# Patient Record
Sex: Male | Born: 1990 | Race: White | Hispanic: No | Marital: Single | Smoking: Never smoker
Health system: Southern US, Community
[De-identification: ages and names within clinical notes are randomized; demographics above are authoritative.]

## PROBLEM LIST (undated history)

## (undated) DIAGNOSIS — L709 Acne, unspecified: Secondary | ICD-10-CM

## (undated) DIAGNOSIS — F909 Attention-deficit hyperactivity disorder, unspecified type: Secondary | ICD-10-CM

## (undated) HISTORY — DX: Attention-deficit hyperactivity disorder, unspecified type: F90.9

## (undated) HISTORY — DX: Acne, unspecified: L70.9

---
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
## 2010-01-03 ENCOUNTER — Telehealth: Payer: Self-pay | Admitting: Internal Medicine

## 2010-01-29 ENCOUNTER — Ambulatory Visit: Payer: Self-pay | Admitting: Internal Medicine

## 2010-03-04 ENCOUNTER — Telehealth (INDEPENDENT_AMBULATORY_CARE_PROVIDER_SITE_OTHER): Payer: Self-pay | Admitting: *Deleted

## 2010-04-07 ENCOUNTER — Telehealth (INDEPENDENT_AMBULATORY_CARE_PROVIDER_SITE_OTHER): Payer: Self-pay | Admitting: *Deleted

## 2010-05-05 ENCOUNTER — Telehealth (INDEPENDENT_AMBULATORY_CARE_PROVIDER_SITE_OTHER): Payer: Self-pay | Admitting: *Deleted

## 2010-06-13 ENCOUNTER — Telehealth (INDEPENDENT_AMBULATORY_CARE_PROVIDER_SITE_OTHER): Payer: Self-pay | Admitting: *Deleted

## 2010-07-22 NOTE — Assessment & Plan Note (Signed)
Summary: pink eye??//lch   Vital Signs:  Patient profile:   20 year old male Height:      70.5 inches Weight:      166.13 pounds BMI:     23.59 Pulse rate:   58 / minute BP sitting:   100 / 56 CC: c/o green drainage L eye matted in am, itchy   History of Present Illness: 20 yo boy here today for ? pink eye.  c/o eye itching throughout the day.  waking up w/ L eye matted shut w/ green discharge.  no drainage throughout the day.  no redness to eye.  hx of seasonal allergies.  not taking anything currently.  no recent exposure to saw dust, metal filings, etc.  no eye pain.  Allergies (verified): No Known Drug Allergies  Review of Systems      See HPI  Physical Exam  General:  alert and well-developed.   Head:  normocephalic and atraumatic.   Eyes:  PERRL, EOMI, no conjunctival injxn or inflammation.  no drainage from eye, no matting of lashes Nose:  + congestion and rhinorrhea Mouth:  +PND   Impression & Recommendations:  Problem # 1:  ALLERGIC RHINITIS CAUSE UNSPECIFIED (ICD-477.9) Assessment Deteriorated pt's eye sxs are not pink eye- they are allergy related.  start Patanase for both eye and nasal sxs.  add OTC claritin/zyrtec.  reviewed supportive care and red flags that should prompt return.  Pt expresses understanding and is in agreement w/ this plan. His updated medication list for this problem includes:    Patanase 0.6 % Soln (Olopatadine hcl) .Marland Kitchen... 2 sprays each nostril two times a day.  Complete Medication List: 1)  Duac Cs 1-5 % Kit (Clindamycin-benzoyl per-cleans) .... Apply once daily 2)  Adderall 15 Mg Tabs (Amphetamine-dextroamphetamine) .... 2 by mouth in am , 1 by mouth in the afternoon if needed 3)  Amoxicillin 500 Mg Caps (Amoxicillin) .... 2 by mouth two times a day x 1 week 4)  Patanase 0.6 % Soln (Olopatadine hcl) .... 2 sprays each nostril two times a day.  Patient Instructions: 1)  This is NOT pink eye- this is eye allergies 2)  Use the nasal spray  to treat both your eye and nose symptoms 3)  2 sprays in each nostril two times a day 4)  Add Claritin or Zyrtec to help with your symptoms 5)  You are not contagious! 6)  Hang in there!! Prescriptions: PATANASE 0.6 % SOLN (OLOPATADINE HCL) 2 sprays each nostril two times a day.  #1 x 3   Entered and Authorized by:   Neena Rhymes MD   Signed by:   Neena Rhymes MD on 10/17/2009   Method used:   Electronically to        [facility] [phone]* (retail)       [address]       [place], [place]  [address]       Ph: [phone]       Fax: [phone]   RxID:   [id number]

## 2010-07-22 NOTE — Progress Notes (Signed)
Summary: refill  Phone Note Refill Request Call back at [phone] Message from:  Patient on September 13, 2009 8:23 AM  Refills Requested: Medication #1:  ADDERALL 15 MG TABS 2 by mouth in am  Method Requested: Pick up at Office Next Appointment Scheduled: no appt Initial call taken by: Barb Merino,  September 13, 2009 8:23 AM    Prescriptions: ADDERALL 15 MG TABS (AMPHETAMINE-DEXTROAMPHETAMINE) 2 by mouth in am , 1 by mouth in the afternoon if needed  #90 x 0   Entered by:   Shary Decamp   Authorized by:   Nolon Rod. Paz MD   Signed by:   Shary Decamp on 09/13/2009   Method used:   Print then Give to Patient   RxID:   [id number]

## 2010-07-22 NOTE — Progress Notes (Signed)
Summary: ADDERALL  Phone Note Refill Request Call back at [phone]  [facility] Message from:  Patient  Refills Requested: Medication #1:  ADDERALL 15 MG TABS 2 by mouth in am ADDERALL. CALL MOTHER  Initial call taken by: Kandice Hams,  July 08, 2009 3:21 PM  Follow-up for Phone Call        LEFT MSG RX WILL BE READY FOR PICKUP .Kandice Hams  July 08, 2009 3:42 PM  Follow-up by: Kandice Hams,  July 08, 2009 3:42 PM    Prescriptions: ADDERALL 15 MG TABS (AMPHETAMINE-DEXTROAMPHETAMINE) 2 by mouth in am , 1 by mouth in the afternoon if needed  #90 x 0   Entered by:   Kandice Hams   Authorized by:   Nolon Rod. Paz MD   Signed by:   Kandice Hams on 07/08/2009   Method used:   Print then Give to Patient   RxID:   [id number]

## 2010-07-22 NOTE — Progress Notes (Signed)
Summary: ok refill, needs office visit within 2 months  Phone Note Refill Request Message from:  Patient  Refills Requested: Medication #1:  ADDERALL 15 MG TABS 2 by mouth in am patient willl pick up [id number]  Initial call taken by: Okey Regal Spring,  January 03, 2010 1:36 PM  Follow-up for Phone Call        Pt has not been in since 12/2008 for a CPX. Last acute was 10/17/09. Okay to fill? Army Fossa CMA  January 03, 2010 1:52 PM  okay to refill this time and an additional time needs a routine visit within the next 2 months Harper Vandervoort E. Amreen Raczkowski MD  January 03, 2010 3:55 PM   Additional Follow-up for Phone Call Additional follow up Details #1::        Will have pt make appt at pick up of rx. Army Fossa CMA  January 03, 2010 4:05 PM     Prescriptions: ADDERALL 15 MG TABS (AMPHETAMINE-DEXTROAMPHETAMINE) 2 by mouth in am , 1 by mouth in the afternoon if needed  #90 x 0   Entered by:   Army Fossa CMA   Authorized by:   Nolon Rod. Alainah Phang MD   Signed by:   Army Fossa CMA on 01/03/2010   Method used:   Print then Give to Patient   RxID:   [id number]

## 2010-07-22 NOTE — Progress Notes (Signed)
Summary: refill  Phone Note Refill Request Call back at Work Phone [phone] Call back at [phone]   Refills Requested: Medication #1:  ADDERALL 15 MG TABS 2 by mouth in am call mom when ready   Method Requested: Pick up at Office Initial call taken by: Okey Regal Spring,  Nov 19, 2009 8:43 AM    Prescriptions: ADDERALL 15 MG TABS (AMPHETAMINE-DEXTROAMPHETAMINE) 2 by mouth in am , 1 by mouth in the afternoon if needed  #90 x 0   Entered by:   Shary Decamp   Authorized by:   Nolon Rod. Paz MD   Signed by:   Shary Decamp on 11/19/2009   Method used:   Print then Give to Patient   RxID:   [id number]
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
## 2010-07-22 NOTE — Progress Notes (Signed)
Summary: refill  Phone Note Refill Request Message from:  Fax from Pharmacy on March 04, 2010 9:21 AM  Refills Requested: Medication #1:  ADDERALL 15 MG TABS 2 by mouth in am mom will pick up wed  [id number] - after 12:00   Initial call taken by: Okey Regal Spring,  March 04, 2010 9:22 AM  Follow-up for Phone Call        will put upfront for pt. Army Fossa CMA  March 04, 2010 9:28 AM     Prescriptions: ADDERALL 15 MG TABS (AMPHETAMINE-DEXTROAMPHETAMINE) 2 by mouth in am , 1 by mouth in the afternoon if needed  #90 x 0   Entered by:   Army Fossa CMA   Authorized by:   Nolon Rod. Paz MD   Signed by:   Army Fossa CMA on 03/04/2010   Method used:   Print then Give to Patient   RxID:   [id number]

## 2010-07-22 NOTE — Progress Notes (Signed)
Summary: REFILL  Phone Note Refill Request Message from:  Patient on May 05, 2010 9:14 AM  Refills Requested: Medication #1:  ADDERALL 15 MG TABS 2 by mouth in am   Dosage confirmed as above?Dosage Confirmed   Supply Requested: 1 month PT AWARE READY FOR PICKUP WITHIN 24 HRS.  Next Appointment Scheduled: NONE Initial call taken by: Lavell Islam,  May 05, 2010 9:14 AM Caller: Patient    Prescriptions: ADDERALL 15 MG TABS (AMPHETAMINE-DEXTROAMPHETAMINE) 2 by mouth in am , 1 by mouth in the afternoon if needed  #90 x 0   Entered by:   Army Fossa CMA   Authorized by:   Nolon Rod. Paz MD   Signed by:   Army Fossa CMA on 05/05/2010   Method used:   Print then Give to Patient   RxID:   [id number]

## 2010-07-22 NOTE — Progress Notes (Signed)
Summary: adderall refill   Phone Note Refill Request Call back at Home Phone [phone] Message from:  Patient on August 09, 2009 11:18 AM  Refills Requested: Medication #1:  ADDERALL 15 MG TABS 2 by mouth in am Initial call taken by: Doristine Devoid,  August 09, 2009 11:18 AM  Follow-up for Phone Call        left detailed msg prescription ready for pick up .....Marland KitchenMarland KitchenDoristine Devoid  August 09, 2009 11:25 AM     Prescriptions: ADDERALL 15 MG TABS (AMPHETAMINE-DEXTROAMPHETAMINE) 2 by mouth in am , 1 by mouth in the afternoon if needed  #90 x 0   Entered by:   Doristine Devoid   Authorized by:   Nolon Rod. Paz MD   Signed by:   Doristine Devoid on 08/09/2009   Method used:   Print then Give to Patient   RxID:   [id number]

## 2010-07-22 NOTE — Progress Notes (Signed)
Summary: ADDERRAL REFILL  Phone Note Call from Patient Call back at [phone]   Caller: Mom--Shannon  [phone] Summary of Call: Patient needs Adderall prescription refilled.  Advised patient that it will be ready in 24 hours for pickup unless someone calls them Initial call taken by: Jerolyn Shin,  April 07, 2010 10:43 AM  Follow-up for Phone Call        placed upfront for pt. Follow-up by: Army Fossa CMA,  April 07, 2010 11:16 AM    Prescriptions: ADDERALL 15 MG TABS (AMPHETAMINE-DEXTROAMPHETAMINE) 2 by mouth in am , 1 by mouth in the afternoon if needed  #90 x 0   Entered by:   Army Fossa CMA   Authorized by:   Nolon Rod. Paz MD   Signed by:   Army Fossa CMA on 04/07/2010   Method used:   Print then Give to Patient   RxID:   [id number]
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
## 2010-07-24 NOTE — Progress Notes (Signed)
Summary: RX  Phone Note Refill Request Call back at [phone] Message from:  Patient  Refills Requested: Medication #1:  ADDERALL 15 MG TABS 2 by mouth in am   Supply Requested: 1 month CALL WHEN READY  Initial call taken by: Freddy Jaksch,  June 13, 2010 12:52 PM  Follow-up for Phone Call        left message for pt that rx was ready for pick up. Army Fossa CMA  June 17, 2010 8:07 AM     Prescriptions: ADDERALL 15 MG TABS (AMPHETAMINE-DEXTROAMPHETAMINE) 2 by mouth in am , 1 by mouth in the afternoon if needed  #90 x 0   Entered by:   Army Fossa CMA   Authorized by:   Nolon Rod. Paz MD   Signed by:   Army Fossa CMA on 06/17/2010   Method used:   Print then Give to Patient   RxID:   [id number]

## 2010-07-28 ENCOUNTER — Telehealth: Payer: Self-pay | Admitting: Internal Medicine

## 2010-08-07 NOTE — Progress Notes (Signed)
Summary: ADDERALL RX  Phone Note Call from Patient Call back at [phone]   Caller: Patient Reason for Call: Refill Medication Summary of Call: PATIENT NEEDS RX FOR REFILL OF ADDERALL 15MG .  LAST FILLED 06-17-2010, LAST OV 01-29-2010.  PLEASE CALL PHONE NUMBER ABOVE WHEN READY FOR PICK-UP. Initial call taken by: Magdalen Spatz [facility],  July 28, 2010 9:05 AM  Follow-up for Phone Call        left detailed message that rx was ready and pt would need office visit next month. Army Fossa CMA  July 28, 2010 10:21 AM     Prescriptions: ADDERALL 15 MG TABS (AMPHETAMINE-DEXTROAMPHETAMINE) 2 by mouth in am , 1 by mouth in the afternoon if needed  #90 x 0   Entered by:   Army Fossa CMA   Authorized by:   Nolon Rod. Matilde Pottenger MD   Signed by:   Army Fossa CMA on 07/28/2010   Method used:   Print then Give to Patient   RxID:   [id number]

## 2010-08-15 ENCOUNTER — Ambulatory Visit: Payer: Self-pay | Admitting: Internal Medicine

## 2010-08-22 ENCOUNTER — Encounter: Payer: Self-pay | Admitting: Internal Medicine

## 2010-08-22 ENCOUNTER — Ambulatory Visit (INDEPENDENT_AMBULATORY_CARE_PROVIDER_SITE_OTHER): Payer: Self-pay | Admitting: Internal Medicine

## 2010-08-22 DIAGNOSIS — L708 Other acne: Secondary | ICD-10-CM

## 2010-08-22 DIAGNOSIS — F909 Attention-deficit hyperactivity disorder, unspecified type: Secondary | ICD-10-CM
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
## 2010-09-22 ENCOUNTER — Telehealth: Payer: Self-pay | Admitting: Internal Medicine

## 2010-09-22 MED ORDER — AMPHETAMINE-DEXTROAMPHETAMINE 15 MG PO TABS: ORAL_TABLET | ORAL | Status: DC

## 2010-09-22 NOTE — Telephone Encounter (Signed)
adderall refill - wants to pick up today

## 2010-09-22 NOTE — Telephone Encounter (Signed)
Pt mom aware rx is ready.

## 2010-10-23 ENCOUNTER — Telehealth: Payer: Self-pay | Admitting: Internal Medicine

## 2010-10-23 MED ORDER — AMPHETAMINE-DEXTROAMPHETAMINE 15 MG PO TABS: ORAL_TABLET | ORAL | Status: DC

## 2010-10-23 NOTE — Telephone Encounter (Signed)
Patient needs refill for adderall---advised Mom that it would be ready for pickup after 3:00 on Friday 10/24/2010 unless she got a phone call

## 2010-10-23 NOTE — Telephone Encounter (Signed)
Will place up front

## 2010-11-25 ENCOUNTER — Other Ambulatory Visit: Payer: Self-pay | Admitting: Internal Medicine

## 2010-11-25 MED ORDER — AMPHETAMINE-DEXTROAMPHETAMINE 15 MG PO TABS: ORAL_TABLET | ORAL | Status: DC

## 2010-11-25 NOTE — Telephone Encounter (Signed)
Will place up front

## 2010-12-30 ENCOUNTER — Other Ambulatory Visit: Payer: Self-pay | Admitting: Internal Medicine

## 2010-12-30 MED ORDER — AMPHETAMINE-DEXTROAMPHETAMINE 15 MG PO TABS: ORAL_TABLET | ORAL | Status: DC

## 2010-12-30 NOTE — Telephone Encounter (Signed)
Placed up front

## 2011-02-04 ENCOUNTER — Other Ambulatory Visit: Payer: Self-pay | Admitting: Internal Medicine

## 2011-02-04 MED ORDER — AMPHETAMINE-DEXTROAMPHETAMINE 15 MG PO TABS: ORAL_TABLET | ORAL | Status: DC

## 2011-02-04 NOTE — Telephone Encounter (Signed)
LMOM to inform Pt Rx ready for p/u M-F 8-5

## 2011-02-04 NOTE — Telephone Encounter (Signed)
Adderall 15 mg request [last refill #90x0 12/30/2010]

## 2011-02-04 NOTE — Telephone Encounter (Signed)
Okay prescribed #90, no refills. Okay to keep refilling until 02-2011

## 2011-03-12 ENCOUNTER — Other Ambulatory Visit: Payer: Self-pay | Admitting: Internal Medicine

## 2011-03-12 MED ORDER — AMPHETAMINE-DEXTROAMPHETAMINE 15 MG PO TABS: ORAL_TABLET | ORAL | Status: DC

## 2011-03-12 NOTE — Telephone Encounter (Signed)
Left Pt detail message Rx will be ready tomorrow by 12 pm

## 2011-04-15 ENCOUNTER — Telehealth: Payer: Self-pay | Admitting: Internal Medicine

## 2011-04-15 MED ORDER — AMPHETAMINE-DEXTROAMPHETAMINE 15 MG PO TABS: ORAL_TABLET | ORAL | Status: DC

## 2011-04-15 NOTE — Telephone Encounter (Signed)
Done

## 2011-04-15 NOTE — Telephone Encounter (Signed)
Last ov  08/22/10 last refill was 03/12/11

## 2011-04-15 NOTE — Telephone Encounter (Signed)
Refill adderall - patient mom wants to pick up today Wednesday [phone]

## 2011-04-15 NOTE — Telephone Encounter (Signed)
Okay #90, no refills. Please tell patient's mother he needs a followup before next refill

## 2011-05-07 ENCOUNTER — Encounter: Payer: Self-pay | Admitting: Internal Medicine

## 2011-05-11 ENCOUNTER — Ambulatory Visit (INDEPENDENT_AMBULATORY_CARE_PROVIDER_SITE_OTHER): Payer: BC Managed Care – PPO | Admitting: Internal Medicine

## 2011-05-11 ENCOUNTER — Encounter: Payer: Self-pay | Admitting: Internal Medicine

## 2011-05-11 VITALS — BP 116/64 | HR 63 | Temp 97.7°F | Ht 71.5 in | Wt 177.4 lb

## 2011-05-11 DIAGNOSIS — F909 Attention-deficit hyperactivity disorder, unspecified type: Secondary | ICD-10-CM

## 2011-05-11 DIAGNOSIS — L708 Other acne: Secondary | ICD-10-CM

## 2011-05-11 DIAGNOSIS — Z23 Encounter for immunization: Secondary | ICD-10-CM

## 2011-05-11 MED ORDER — AMPHETAMINE-DEXTROAMPHETAMINE 15 MG PO TABS: ORAL_TABLET | ORAL | Status: DC
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
## 2011-05-11 NOTE — Assessment & Plan Note (Signed)
Well-controlled, refill medications. Back in 6 months.

## 2011-05-11 NOTE — Assessment & Plan Note (Signed)
Currently, mild symptoms, on no medicines

## 2011-05-11 NOTE — Patient Instructions (Signed)
Came back in 6 months for a check up, call for refills

## 2011-06-29 ENCOUNTER — Telehealth: Payer: Self-pay | Admitting: Internal Medicine

## 2011-06-29 MED ORDER — AMPHETAMINE-DEXTROAMPHETAMINE 15 MG PO TABS: ORAL_TABLET | ORAL | Status: DC

## 2011-06-29 NOTE — Telephone Encounter (Signed)
Rx ready for pick up. 

## 2011-06-29 NOTE — Telephone Encounter (Signed)
Patients mom states that patient needs a refill on adderall

## 2011-07-30 ENCOUNTER — Other Ambulatory Visit: Payer: Self-pay

## 2011-07-30 NOTE — Telephone Encounter (Signed)
Refill request Adderall. OK to refill?

## 2011-07-30 NOTE — Telephone Encounter (Signed)
Call from patient requesting Adderall Rx. Please advise     Kp

## 2011-07-31 MED ORDER — AMPHETAMINE-DEXTROAMPHETAMINE 15 MG PO TABS: ORAL_TABLET | ORAL | Status: DC

## 2011-07-31 NOTE — Telephone Encounter (Signed)
Addended by: Edwena Felty T on: 07/31/2011 12:12 PM   Modules accepted: Orders

## 2011-07-31 NOTE — Telephone Encounter (Signed)
Refill done.  

## 2011-07-31 NOTE — Telephone Encounter (Signed)
90, 0 RF 

## 2011-09-07 ENCOUNTER — Other Ambulatory Visit: Payer: Self-pay | Admitting: *Deleted

## 2011-09-07 MED ORDER — AMPHETAMINE-DEXTROAMPHETAMINE 15 MG PO TABS: ORAL_TABLET | ORAL | Status: DC

## 2011-09-07 NOTE — Telephone Encounter (Signed)
Pt mom aware Rx ready.

## 2011-09-11 ENCOUNTER — Encounter: Payer: Self-pay | Admitting: *Deleted

## 2011-10-13 ENCOUNTER — Other Ambulatory Visit: Payer: Self-pay

## 2011-10-13 MED ORDER — AMPHETAMINE-DEXTROAMPHETAMINE 15 MG PO TABS: ORAL_TABLET | ORAL | Status: DC

## 2011-10-13 NOTE — Telephone Encounter (Signed)
Carollee Herter aware that Rx is ready for pick up.      KP

## 2011-11-12 ENCOUNTER — Telehealth: Payer: Self-pay | Admitting: *Deleted

## 2011-11-12 MED ORDER — AMPHETAMINE-DEXTROAMPHETAMINE 15 MG PO TABS: ORAL_TABLET | ORAL | Status: DC

## 2011-11-12 NOTE — Telephone Encounter (Signed)
90, 0 RF 

## 2011-11-12 NOTE — Telephone Encounter (Signed)
Pt's mother called requesting a refill for Adderall. OK to refill?

## 2011-11-12 NOTE — Telephone Encounter (Signed)
Refill done.  

## 2012-01-18 ENCOUNTER — Other Ambulatory Visit: Payer: Self-pay | Admitting: *Deleted

## 2012-01-18 MED ORDER — AMPHETAMINE-DEXTROAMPHETAMINE 15 MG PO TABS: ORAL_TABLET | ORAL | Status: DC

## 2012-01-18 NOTE — Telephone Encounter (Signed)
Left Pt detail message that Rx is ready for pick up and OV is due prior to next refill, also placed note on Rx.

## 2012-01-18 NOTE — Telephone Encounter (Signed)
Last OV 11-12-10 #90, last OV 05-11-11 no pending Appt.

## 2012-01-18 NOTE — Telephone Encounter (Signed)
90, 0 RF also, please call patient--->  he needs office visit before next refill

## 2012-02-16 ENCOUNTER — Encounter: Payer: Self-pay | Admitting: Internal Medicine

## 2012-02-16 ENCOUNTER — Ambulatory Visit (INDEPENDENT_AMBULATORY_CARE_PROVIDER_SITE_OTHER): Payer: BC Managed Care – PPO | Admitting: Internal Medicine

## 2012-02-16 VITALS — BP 122/82 | HR 87 | Temp 97.8°F | Wt 184.0 lb

## 2012-02-16 DIAGNOSIS — F909 Attention-deficit hyperactivity disorder, unspecified type: Secondary | ICD-10-CM

## 2012-02-16 MED ORDER — AMPHETAMINE-DEXTROAMPHETAMINE 15 MG PO TABS: ORAL_TABLET | ORAL | Status: DC

## 2012-02-16 NOTE — Patient Instructions (Addendum)
Call for refills as needed. Next office visit in 6 months ------ If  redness, swelling or bleeding from the wound, either call or go to the health clinic at [facility]
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
## 2012-02-16 NOTE — Assessment & Plan Note (Signed)
Patient reports he could use a higher dose of meds, on further questioning, he has class from 12:30 PM to 9:30 PM and is taking 30 mg in am and 15 at PM Recommend to adjust doses according to his needs, for instance he could take 15 mg in the morning and 30 mg dose at 12:30 before class. Will call if that is not working for him

## 2012-03-16 ENCOUNTER — Telehealth: Payer: Self-pay

## 2012-03-16 MED ORDER — AMPHETAMINE-DEXTROAMPHETAMINE 15 MG PO TABS: ORAL_TABLET | ORAL | Status: DC

## 2012-03-16 NOTE — Telephone Encounter (Signed)
Done

## 2012-03-16 NOTE — Telephone Encounter (Signed)
Pt notified ready for pickup.

## 2012-03-16 NOTE — Telephone Encounter (Signed)
Pt's mother calling for his Adderall rx.  Please call when ready.

## 2012-04-20 ENCOUNTER — Other Ambulatory Visit: Payer: Self-pay

## 2012-04-20 NOTE — Addendum Note (Signed)
Addended by: Mal Amabile on: 04/20/2012 12:30 PM   Modules accepted: Orders

## 2012-04-20 NOTE — Telephone Encounter (Signed)
OV 02/16/12 Last filled Adderall 03/16/12 #90 no refill.  Plz advise    MW

## 2012-04-20 NOTE — Telephone Encounter (Signed)
OV 02/16/12 Adderall last filled 03/16/12 #90 no refills   Plz advise    MW

## 2012-04-21 MED ORDER — AMPHETAMINE-DEXTROAMPHETAMINE 15 MG PO TABS: ORAL_TABLET | ORAL | Status: DC

## 2012-04-21 NOTE — Telephone Encounter (Signed)
Called pt left message to make aware Rx ready for pick up.    MW

## 2012-04-21 NOTE — Telephone Encounter (Signed)
done

## 2012-05-23 ENCOUNTER — Other Ambulatory Visit: Payer: Self-pay

## 2012-05-23 MED ORDER — AMPHETAMINE-DEXTROAMPHETAMINE 15 MG PO TABS: ORAL_TABLET | ORAL | Status: DC

## 2012-05-23 NOTE — Telephone Encounter (Signed)
Pt made aware rx is ready for pick up. 

## 2012-05-23 NOTE — Telephone Encounter (Signed)
done

## 2012-05-23 NOTE — Telephone Encounter (Signed)
OV 02/16/12 Last filled 04/20/12 # 90 no refills plz advise    MW

## 2012-06-27 ENCOUNTER — Telehealth: Payer: Self-pay | Admitting: Internal Medicine

## 2012-06-27 ENCOUNTER — Encounter: Payer: Self-pay | Admitting: *Deleted

## 2012-06-27 MED ORDER — AMPHETAMINE-DEXTROAMPHETAMINE 15 MG PO TABS: ORAL_TABLET | ORAL | Status: DC

## 2012-06-27 NOTE — Telephone Encounter (Signed)
PTs mother called requesting rx for adderall. Call her at [phone] when ready for pick up. Patient goes to [facility] but is in town until this evening. His mother is not listed on his DPR.

## 2012-06-27 NOTE — Telephone Encounter (Signed)
OK to refill? Last OV 8.27.13 Last filled 12.2.13.

## 2012-06-27 NOTE — Telephone Encounter (Signed)
Done. Needs to sign a controlled substance agreement

## 2012-06-27 NOTE — Telephone Encounter (Signed)
Discussed with pt & made aware he has to come by & pick up rx & sign contract.

## 2012-07-27 ENCOUNTER — Telehealth: Payer: Self-pay | Admitting: Internal Medicine

## 2012-07-27 MED ORDER — AMPHETAMINE-DEXTROAMPHETAMINE 15 MG PO TABS: ORAL_TABLET | ORAL | Status: DC

## 2012-07-27 NOTE — Telephone Encounter (Signed)
Ok to refill? Last OV 8.27.13 Last filled 1.6.14

## 2012-07-27 NOTE — Telephone Encounter (Signed)
Patients mother called to request rx for adderall. Call [phone] when ready for pick up.

## 2012-07-27 NOTE — Telephone Encounter (Signed)
Done

## 2012-07-27 NOTE — Telephone Encounter (Signed)
Left msg on pt's vmail to make pt aware rx is ready at front desk to be picked up.

## 2012-08-18 ENCOUNTER — Encounter: Payer: Self-pay | Admitting: Internal Medicine

## 2012-08-25 ENCOUNTER — Telehealth: Payer: Self-pay | Admitting: *Deleted

## 2012-08-25 MED ORDER — AMPHETAMINE-DEXTROAMPHETAMINE 15 MG PO TABS: ORAL_TABLET | ORAL | Status: DC

## 2012-08-25 NOTE — Telephone Encounter (Signed)
Call mom , will RF this time but needs to be tested again before next RF

## 2012-08-25 NOTE — Telephone Encounter (Signed)
Last OV 02-16-12, last filled 07-28-11, Pt was high risk and screen was negative.Per office protocol Pt is supposed to be screen with every pick however Pt is at school [employer] and Pt mom called to request refill on med but is not on Pt DPR so unable to speak with her.Please advise on refill

## 2012-08-25 NOTE — Telephone Encounter (Signed)
Discussed with pt, he states that he would like for his mom to come by & pick up Rx & it's for her to be added to [facility].

## 2012-08-25 NOTE — Telephone Encounter (Signed)
Left msg for pt to return call.  Pt's mother is not on DPR.

## 2012-09-27 ENCOUNTER — Telehealth: Payer: Self-pay | Admitting: *Deleted

## 2012-09-27 MED ORDER — AMPHETAMINE-DEXTROAMPHETAMINE 15 MG PO TABS: ORAL_TABLET | ORAL | Status: DC

## 2012-09-27 NOTE — Telephone Encounter (Signed)
Rx ready for pick up called to advise Pt per Dr Drue Novel last instruction that he would RF this time but needs to be tested again before next RF. Pt states that he is currently at school and has about 2 weeks left with finals approaching. Pt indicated that he does not plan on coming home and does not want to go without this med. Pt would like to know if Dr Drue Novel is willing to waive this again.Please advise

## 2012-09-27 NOTE — Telephone Encounter (Signed)
Pt made aware

## 2012-09-27 NOTE — Telephone Encounter (Signed)
Ok RF No further RF w/o screening , let pt know

## 2012-11-04 ENCOUNTER — Telehealth: Payer: Self-pay | Admitting: Internal Medicine

## 2012-11-04 NOTE — Telephone Encounter (Signed)
Needs office visit.

## 2012-11-04 NOTE — Telephone Encounter (Signed)
Called pt and left a message informing him.

## 2012-11-04 NOTE — Telephone Encounter (Signed)
Patient called requesting rx for adderall. Call [phone].

## 2012-11-04 NOTE — Telephone Encounter (Signed)
Adderall refill request. Pt last seen on 02-16-12. Med last filled 09-27-12 #90 with 0 refills. Ok to fill?

## 2012-11-08 ENCOUNTER — Encounter: Payer: Self-pay | Admitting: Lab

## 2012-11-09 ENCOUNTER — Ambulatory Visit (INDEPENDENT_AMBULATORY_CARE_PROVIDER_SITE_OTHER): Payer: BC Managed Care – PPO | Admitting: Internal Medicine

## 2012-11-09 ENCOUNTER — Encounter: Payer: Self-pay | Admitting: *Deleted

## 2012-11-09 VITALS — BP 120/76 | HR 77 | Temp 97.9°F | Wt 176.0 lb

## 2012-11-09 DIAGNOSIS — F909 Attention-deficit hyperactivity disorder, unspecified type: Secondary | ICD-10-CM

## 2012-11-09 MED ORDER — AMPHETAMINE-DEXTROAMPHETAMINE 15 MG PO TABS: ORAL_TABLET | ORAL | Status: DC

## 2012-11-09 NOTE — Assessment & Plan Note (Signed)
Symptoms well-controlled, prescription filled. Last urine test came back negative for Adderall, we'll recheck today. The patient found a physician at [facility] that could refill his medication, this other physician just needs some documentation in order to prescribe it: see letter We will recheck a urine test today noting that he has only taken 2 tablets yesterday and nothing last week.
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
## 2012-11-09 NOTE — Patient Instructions (Addendum)
If the other doctor prescribe your medication , come back as needed. Otherwise please come back in 6 months.

## 2012-11-10 ENCOUNTER — Encounter: Payer: Self-pay | Admitting: Internal Medicine

## 2012-11-21 ENCOUNTER — Encounter: Payer: Self-pay | Admitting: Internal Medicine

## 2012-12-09 ENCOUNTER — Telehealth: Payer: Self-pay | Admitting: *Deleted

## 2012-12-09 NOTE — Telephone Encounter (Signed)
11-09-12 Last OV, Last refilled #90

## 2012-12-11 NOTE — Telephone Encounter (Signed)
Ok   1 month

## 2012-12-12 MED ORDER — AMPHETAMINE-DEXTROAMPHETAMINE 15 MG PO TABS: ORAL_TABLET | ORAL | Status: DC

## 2012-12-12 NOTE — Telephone Encounter (Signed)
Left Pt detail message Rx ready for pickup

## 2013-01-16 ENCOUNTER — Telehealth: Payer: Self-pay | Admitting: *Deleted

## 2013-01-16 NOTE — Telephone Encounter (Signed)
Ok 1 month supply 

## 2013-01-16 NOTE — Telephone Encounter (Signed)
Mom left message on triage requesting refill for pts adderall. Date of last OV 11/09/12 and med last filled on 11/21/12 for #90 with no RF. Okay to refill?

## 2013-01-17 MED ORDER — AMPHETAMINE-DEXTROAMPHETAMINE 15 MG PO TABS: ORAL_TABLET | ORAL | Status: DC

## 2013-01-17 NOTE — Telephone Encounter (Signed)
Refill done per orders and pt. Made aware rx ready for pickup 

## 2013-02-27 ENCOUNTER — Telehealth: Payer: Self-pay | Admitting: General Practice

## 2013-02-27 MED ORDER — AMPHETAMINE-DEXTROAMPHETAMINE 15 MG PO TABS: ORAL_TABLET | ORAL | Status: DC

## 2013-02-27 NOTE — Telephone Encounter (Signed)
Adderall refill Last OV 11-09-12 Med last filled 01-17-13 390   Med contract on file  High Risk

## 2013-02-27 NOTE — Telephone Encounter (Signed)
Correction, last UDS 10/2012 low risk, okay to refill, prescription printed

## 2013-02-28 ENCOUNTER — Telehealth: Payer: Self-pay | Admitting: *Deleted

## 2013-02-28 NOTE — Telephone Encounter (Signed)
Pt notified via telephone rx pickup. DJR

## 2013-03-27 ENCOUNTER — Telehealth: Payer: Self-pay | Admitting: *Deleted

## 2013-03-27 MED ORDER — AMPHETAMINE-DEXTROAMPHETAMINE 15 MG PO TABS: ORAL_TABLET | ORAL | Status: DC

## 2013-03-27 NOTE — Telephone Encounter (Signed)
Last UDS 10/2012 low risk, okay to refill, prescription printed Due for an appointment 04-2013, if he does not have an appointment scheduled please arrange one.

## 2013-03-27 NOTE — Telephone Encounter (Signed)
Pt is requesting refill on Adderall.  Last seen on 11/09/2012. Last filled on 02/27/2013. UDS on 11/09/2012, contract signed. Please advise

## 2013-03-28 ENCOUNTER — Telehealth: Payer: Self-pay | Admitting: *Deleted

## 2013-03-28 NOTE — Telephone Encounter (Signed)
Pt will call back and schedule  the appointment, he is currently away at school.

## 2013-03-28 NOTE — Telephone Encounter (Signed)
Call patient and left message to return call to schedule  follow up appointment

## 2013-03-28 NOTE — Telephone Encounter (Signed)
Error

## 2013-05-05 ENCOUNTER — Encounter: Payer: Self-pay | Admitting: Internal Medicine

## 2013-05-05 ENCOUNTER — Ambulatory Visit (INDEPENDENT_AMBULATORY_CARE_PROVIDER_SITE_OTHER): Payer: BC Managed Care – PPO | Admitting: Internal Medicine

## 2013-05-05 VITALS — BP 126/86 | HR 93 | Temp 98.4°F | Wt 178.0 lb

## 2013-05-05 DIAGNOSIS — F909 Attention-deficit hyperactivity disorder, unspecified type: Secondary | ICD-10-CM

## 2013-05-05 DIAGNOSIS — Z23 Encounter for immunization: Secondary | ICD-10-CM

## 2013-05-05 MED ORDER — AMPHETAMINE-DEXTROAMPHETAMINE 15 MG PO TABS: ORAL_TABLET | ORAL | Status: DC

## 2013-05-05 NOTE — Progress Notes (Signed)
Pre visit review using our clinic review tool, if applicable. No additional management support is needed unless otherwise documented below in the visit note. 

## 2013-05-05 NOTE — Patient Instructions (Signed)
Get your UDS  before you leave  Next visit in 6 months for a   follow up , ADD. No Fasting Please make an appointment
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
## 2013-05-05 NOTE — Assessment & Plan Note (Signed)
Doing very well, refill medications. Reports he has good time management skills. UDS today (Last adderall was more than 24 hours ago)

## 2013-05-24 ENCOUNTER — Telehealth: Payer: Self-pay | Admitting: Internal Medicine

## 2013-05-24 NOTE — Telephone Encounter (Signed)
UDS was + for amphetamines but aAlso showed alcohol---->  I spoke with the toxicologist, the urine alcohol level lags  8 hours behind the blood level thus it represent the level from previous day. Plan-- moderate risk

## 2013-06-06 ENCOUNTER — Telehealth: Payer: Self-pay | Admitting: *Deleted

## 2013-06-06 MED ORDER — AMPHETAMINE-DEXTROAMPHETAMINE 15 MG PO TABS: ORAL_TABLET | ORAL | Status: DC

## 2013-06-06 NOTE — Telephone Encounter (Signed)
Done  UDS 12-14 Was moderate risk, repeat by March

## 2013-06-06 NOTE — Telephone Encounter (Signed)
Patient's mother is requesting refill for Adderall  Last seen-05/05/2013  Last filled-05/05/2013  UDS-11/09/2012, contract signed   Please advise. SW

## 2013-06-12 NOTE — Telephone Encounter (Signed)
Patient notified that script was ready to be picked up.

## 2013-07-24 ENCOUNTER — Telehealth: Payer: Self-pay

## 2013-07-24 MED ORDER — AMPHETAMINE-DEXTROAMPHETAMINE 15 MG PO TABS: ORAL_TABLET | ORAL | Status: DC

## 2013-07-24 NOTE — Telephone Encounter (Signed)
Mother, Bebe LiterShannon Engel, called requesting prescription refill:  Adderall 15 mg; 2 PO in am and 1 in afternoon as needed.   Last OV: 05/05/13 Last Filled:  06/06/13 UDS- 11/09/12 (risk level not indicated)   Contract- signed on 06/27/12            Please advise.

## 2013-07-24 NOTE — Telephone Encounter (Signed)
UDS 12-14 Was moderate risk, repeat by March 2015 Okay to refill

## 2013-07-24 NOTE — Addendum Note (Signed)
Addended by: Willow OraPAZ, Pelham Hennick E on: 07/24/2013 05:13 PM   Modules accepted: Orders

## 2013-07-25 ENCOUNTER — Telehealth: Payer: Self-pay

## 2013-07-25 NOTE — Telephone Encounter (Signed)
Prescription available.  Patient/Mother made aware.  They may not be able to pick up prescription until Monday.

## 2013-08-07 ENCOUNTER — Encounter: Payer: Self-pay | Admitting: Internal Medicine

## 2013-09-04 ENCOUNTER — Telehealth: Payer: Self-pay | Admitting: *Deleted

## 2013-09-04 NOTE — Telephone Encounter (Signed)
amphetamine-dextroamphetamine (ADDERALL) 15 MG tablet Last OV: 05/05/2013 Last refill: 07/24/13 #90 Moderate risk, needs UDS

## 2013-09-05 MED ORDER — AMPHETAMINE-DEXTROAMPHETAMINE 15 MG PO TABS: ORAL_TABLET | ORAL | Status: DC

## 2013-09-05 NOTE — Telephone Encounter (Signed)
Ok RF, UDS at time of Rx pick up

## 2013-09-05 NOTE — Telephone Encounter (Signed)
LMOVM- rx at front desk rdy for pick up

## 2013-10-04 ENCOUNTER — Telehealth: Payer: Self-pay | Admitting: Internal Medicine

## 2013-10-04 MED ORDER — AMPHETAMINE-DEXTROAMPHETAMINE 15 MG PO TABS: ORAL_TABLET | ORAL | Status: DC

## 2013-10-04 NOTE — Telephone Encounter (Signed)
Patient called and requested a refill for adderall. Please advise.  °

## 2013-10-04 NOTE — Telephone Encounter (Signed)
rx refill- adderall 15mg  Last OV- 05/05/13 Last refilled- 09/05/13 #90 / 0 rf UDS- 05/24/13 moderate risk.

## 2013-10-04 NOTE — Telephone Encounter (Signed)
Okay to refill, due for a UDS at time of pick up

## 2013-10-05 NOTE — Telephone Encounter (Signed)
Pt verbalized understanding UDS. rx rdy for pick up at front desk.

## 2013-11-27 ENCOUNTER — Telehealth: Payer: Self-pay | Admitting: Internal Medicine

## 2013-11-27 MED ORDER — AMPHETAMINE-DEXTROAMPHETAMINE 15 MG PO TABS: ORAL_TABLET | ORAL | Status: DC

## 2013-11-27 NOTE — Telephone Encounter (Signed)
Pt came by stating he needed a refill on his Adderall and was told a wk ago to come by and do UDS in order to get refill.  RX was not at the front desk and last phone call in the chart was in Apr relating to a refill.  Please advise.

## 2013-11-27 NOTE — Telephone Encounter (Signed)
rx refill- adderrall 15mg   Last OV- 05/05/13 Last refilled- 10/04/13 # 90 / 0 rf  UDS- 05/24/13 moderate

## 2013-11-27 NOTE — Telephone Encounter (Signed)
UDS was requested 09-2013, get results please

## 2013-11-27 NOTE — Telephone Encounter (Signed)
I have the results from the past year, last UDS was collected on 05/05/2013. We did not collect a UDS in April when the patient picked up his prescription even though the documentation states the patient was told he needed to do one.

## 2013-11-27 NOTE — Telephone Encounter (Signed)
Prescription printed, to be released only when he provides the UDS sample

## 2013-11-28 NOTE — Telephone Encounter (Signed)
LMOVM. rx front desk / UDS required at time of pick up

## 2013-11-29 NOTE — Telephone Encounter (Signed)
Gave info to mom and she voiced she understood and would relay message to her son.

## 2013-12-15 ENCOUNTER — Telehealth: Payer: Self-pay | Admitting: *Deleted

## 2013-12-15 NOTE — Telephone Encounter (Signed)
UDS MOD risk- 11/27/13 @ DR.PAZ

## 2013-12-21 ENCOUNTER — Encounter: Payer: Self-pay | Admitting: Internal Medicine

## 2013-12-27 ENCOUNTER — Telehealth: Payer: Self-pay | Admitting: *Deleted

## 2013-12-27 MED ORDER — AMPHETAMINE-DEXTROAMPHETAMINE 15 MG PO TABS: ORAL_TABLET | ORAL | Status: DC

## 2013-12-27 NOTE — Telephone Encounter (Signed)
lmovm . rx rdy for pick up front desk , needs OV

## 2013-12-27 NOTE — Telephone Encounter (Signed)
rx printed  Needs OV before next RF, be sure pt is aware

## 2013-12-27 NOTE — Telephone Encounter (Signed)
Caller name:  Orpah ClintonCollin Relation to pt:  self Call back number: [phone]  Pharmacy:  [facility] [address]  Reason for call:   Pt called to request refill on   amphetamine-dextroamphetamine (ADDERALL) 15 MG tablet Last filled 11/27/13. #90, no refills Last OV  05/05/13

## 2013-12-27 NOTE — Telephone Encounter (Signed)
Patient is requesting a refill for Adderall. Last OV 04/2013 Last filled 11/25/13 #90 Contract on file UDS 11/27/13 Moderate Risk (neg for adderall)  Okay to refill?

## 2014-04-26 ENCOUNTER — Telehealth: Payer: Self-pay

## 2014-04-26 NOTE — Telephone Encounter (Signed)
Last appt was 05/05/2013, he was informed last refill that he will need OV before next refill which was on 12/2013. Please inform Pt he will need to make appt.

## 2014-04-26 NOTE — Telephone Encounter (Signed)
Patrick Choi [phone]  Patrick Choi needs a refill on his amphetamine-dextroamphetamine (ADDERALL) 15 MG tablet, call when ready for pick up.

## 2014-04-26 NOTE — Telephone Encounter (Signed)
Left message for Patrick Choi to call office to schedule OV before he can get another refill on medicine

## 2014-04-27 ENCOUNTER — Encounter: Payer: Self-pay | Admitting: Internal Medicine

## 2014-04-27 ENCOUNTER — Ambulatory Visit (INDEPENDENT_AMBULATORY_CARE_PROVIDER_SITE_OTHER): Payer: BC Managed Care – PPO | Admitting: Internal Medicine

## 2014-04-27 VITALS — BP 118/62 | HR 83 | Temp 98.1°F | Wt 199.4 lb

## 2014-04-27 DIAGNOSIS — F902 Attention-deficit hyperactivity disorder, combined type: Secondary | ICD-10-CM

## 2014-04-27 MED ORDER — AMPHETAMINE-DEXTROAMPHETAMINE 15 MG PO TABS: ORAL_TABLET | ORAL | Status: AC

## 2014-04-27 MED ORDER — AMPHETAMINE-DEXTROAMPHETAMINE 15 MG PO TABS: ORAL_TABLET | ORAL | Status: DC

## 2014-04-27 NOTE — Assessment & Plan Note (Signed)
Has not been taking the medication for the last few weeks but he is ready to restart. Feeling well, no apparent side effects. Last UDS was 11-2013, moderate because there was no adderall in the urine however he may have run out of medications. Plan:  Refill medicines Now living in [place] but usually visit [place] monthly Next visit 6 months, at some point he will be asked to get a UDS

## 2014-04-27 NOTE — Progress Notes (Signed)
Pre visit review using our clinic review tool, if applicable. No additional management support is needed unless otherwise documented below in the visit note. 

## 2014-04-27 NOTE — Patient Instructions (Addendum)
Please come back to the office in 6 months  for a routine check up    
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
